# Patient Record
Sex: Male | Born: 1990 | Race: Black or African American | Hispanic: No | Marital: Single | Smoking: Former smoker
Health system: Southern US, Community
[De-identification: ages and names within clinical notes are randomized; demographics above are authoritative.]

---
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
## 2008-02-16 IMAGING — CR DG CHEST 2V
2 series · 2 of 2 positions shown · non-contrast
Comparison: None.

CLINICAL DATA: Difficulty breathing.   Chest tightness.  
 CHEST - 2 VIEW:

[view not recorded (1 of 2)]
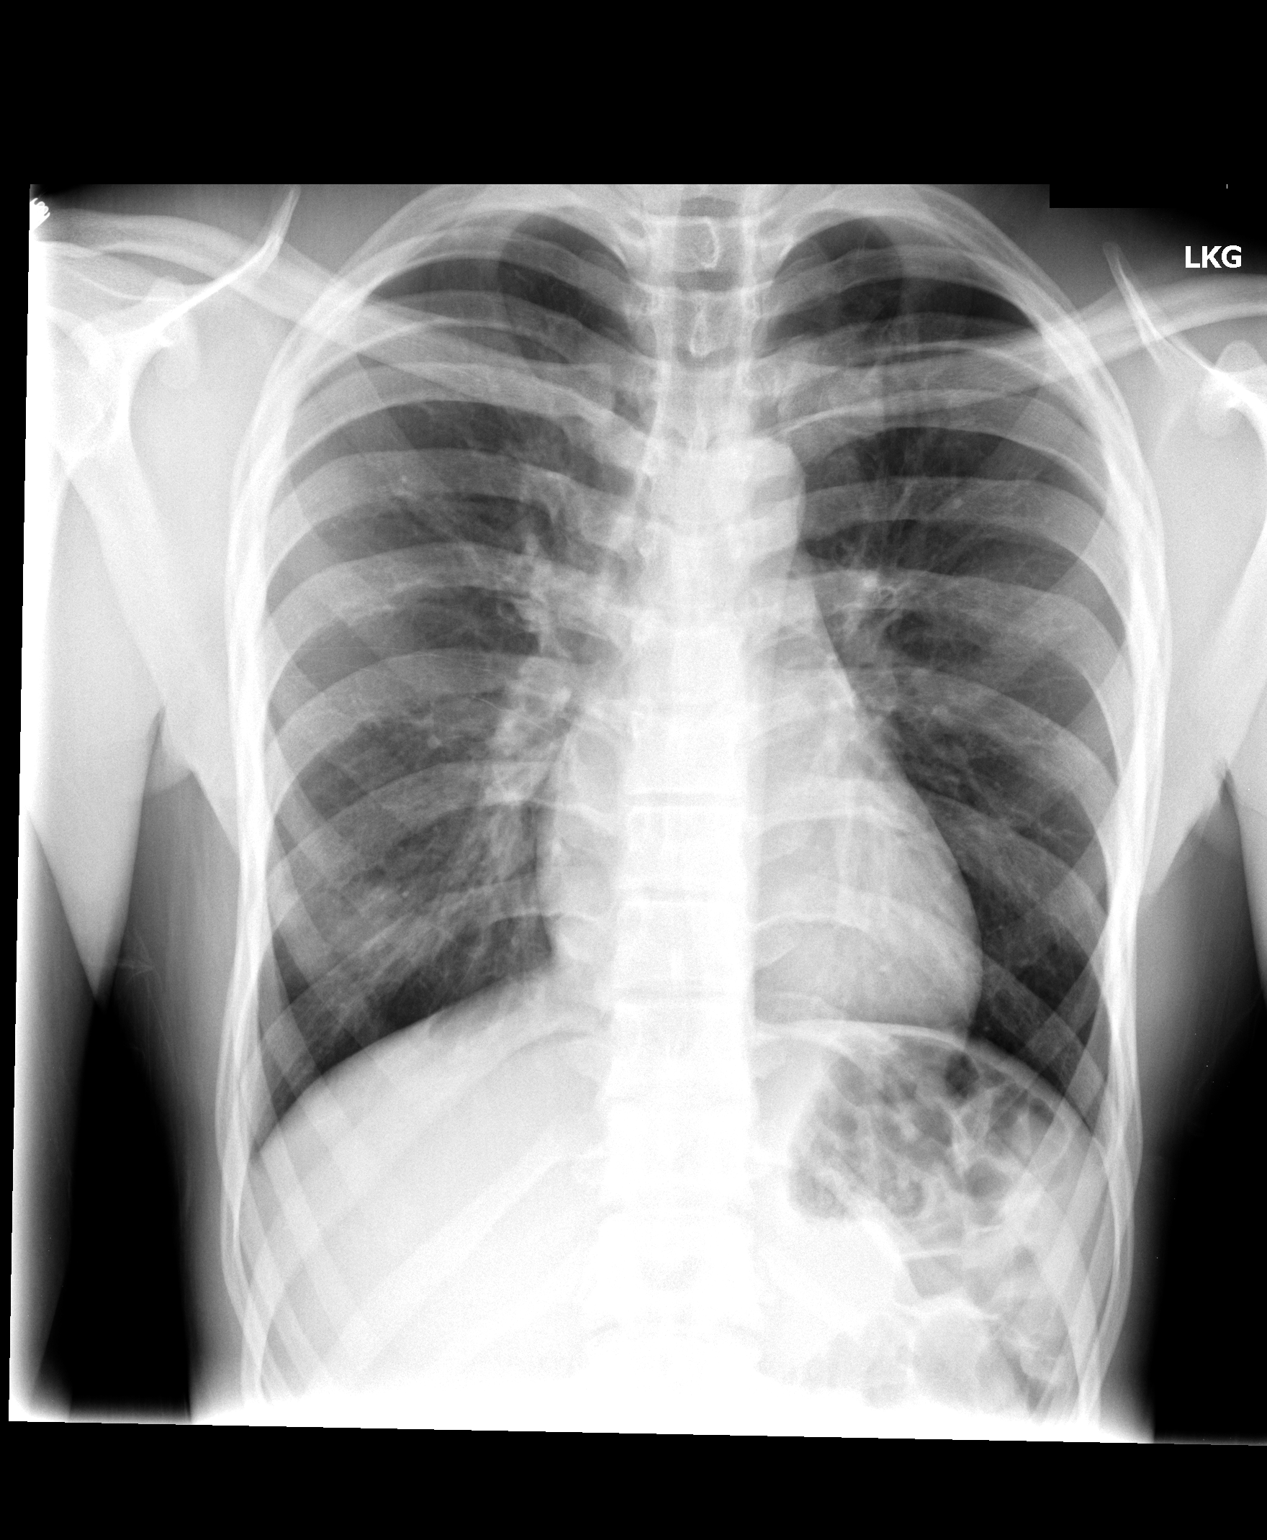

[view not recorded (2 of 2)]
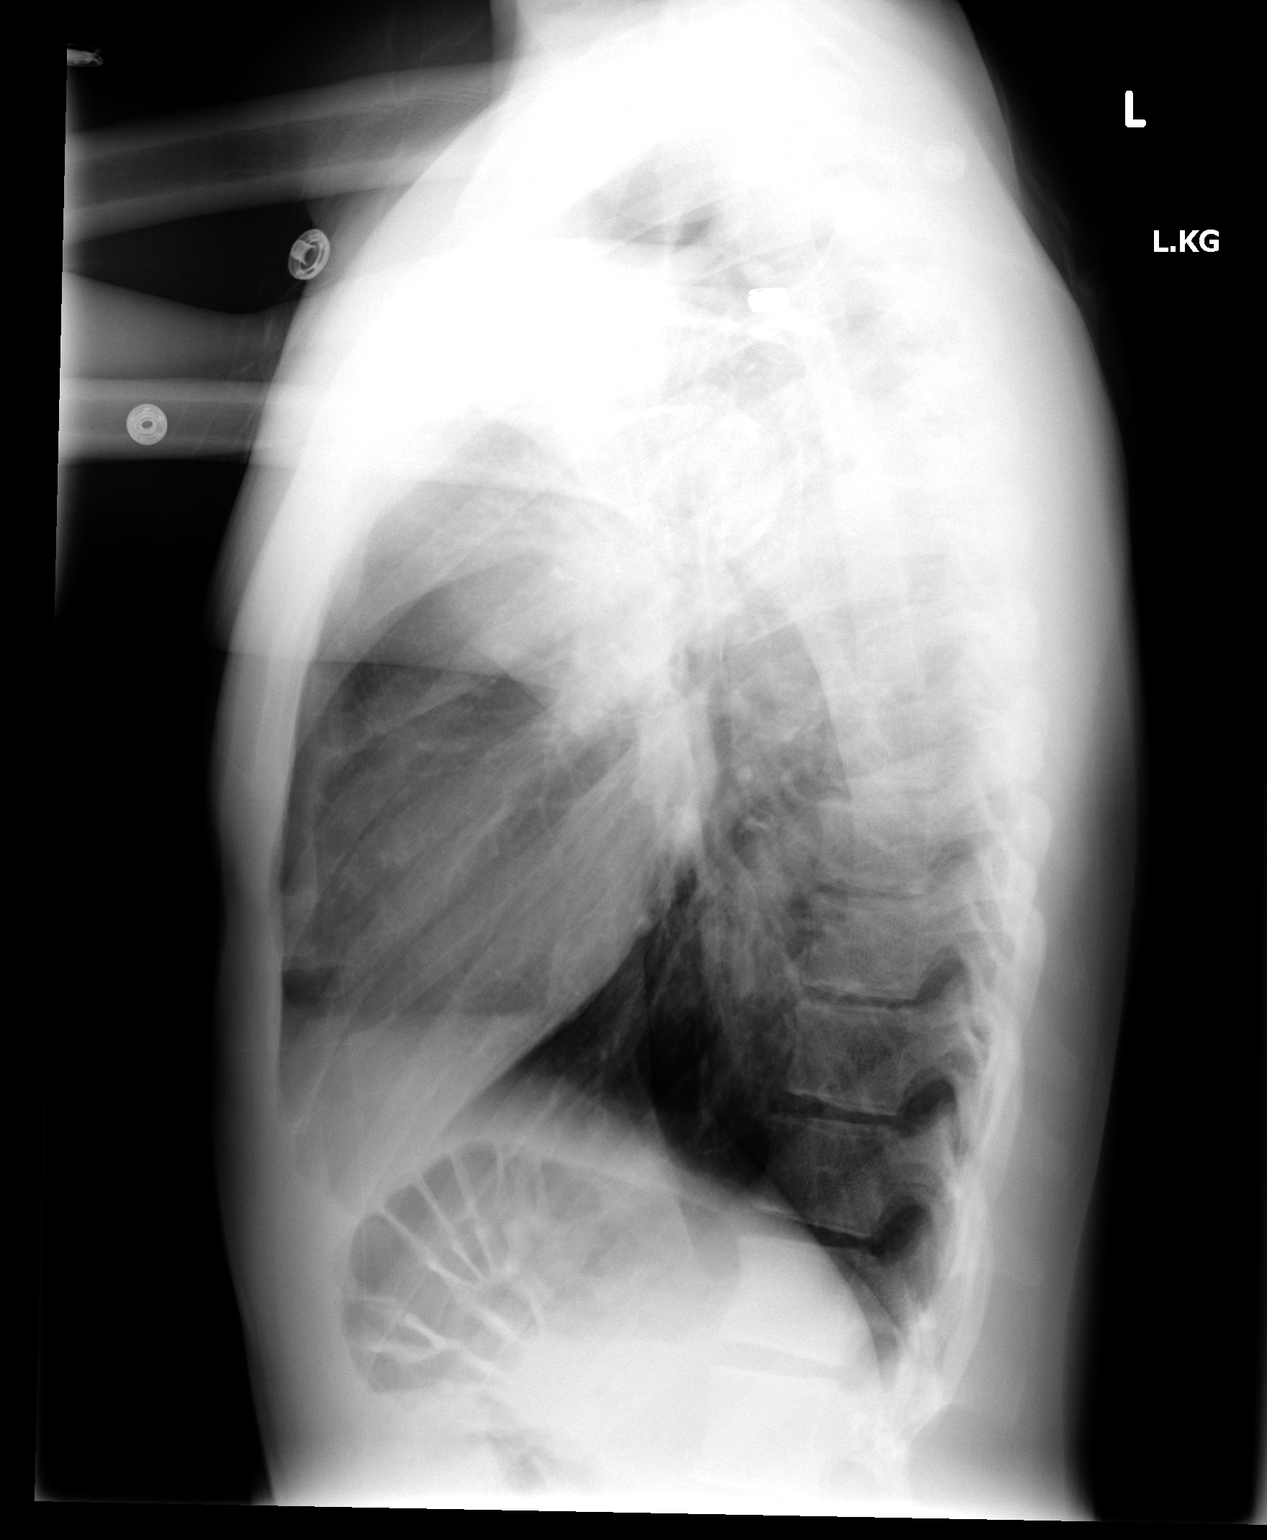

[2 of 2 positions shown; findings below may reference images not displayed]

FINDINGS: The trachea is midline.  Heart size is normal.  There is lobulated soft tissue density projecting over the lower right hemithorax.  The lungs are otherwise clear.   No pleural fluid.
IMPRESSION: Lobulated soft tissue density projecting over the right lung base.    Question gynecomastia.  Infectious etiology not excluded.   Please correlate clinically.  
 Chest CT or follow-up PA and lateral views of the chest would be useful in further evaluation, as clinically indicated.

## 2008-10-20 ENCOUNTER — Emergency Department (HOSPITAL_COMMUNITY): Admission: EM | Admit: 2008-10-20 | Discharge: 2008-10-20 | Payer: Self-pay | Admitting: Family Medicine

## 2010-11-17 NOTE — Discharge Summary (Signed)
[place]  Patient:    Steven [name]                   MRN: [id number] Adm. Date:  [id number] Disc. Date: 12/30/00 Attending:  Asher Muir Dictator:   Emelda Fear, M.D.                           Discharge Summary  DATE OF BIRTH:  1991/02/24  DISCHARGE MEDICATIONS: 1. Prednisone 40 mg 2 tablets q.a.m. x 5 days. 2. Albuterol MDI with spacer 2 puffs q.i.d. x 5 days, then take only as    needed. 3. Serevent MDI with spacer 2 puffs b.i.d.  Start medication once prednisone    regimen completed. 4. Flovent MDI with spacer 2 puffs b.i.d.  HISTORY OF PRESENT ILLNESS AND HOSPITAL COURSE:  This is a 20 year old African-American male who presented Friday, December 27, 2000, secondary to shortness of breath and wheezing.  Per patient and mother, the patient had been on a regimen of Flovent 220 b.i.d., Serevent b.i.d., Zyrtec 10 q.d., Singulair 10 q.d., and Nasacort for several years.  Approximately seven months ago the patient decided to take himself off of those medications.  Two days before presentation the patient began taking albuterol nebulizers b.i.d. on his own when the shortness of breath began.  Upon presentation to the emergency department, the patient was evaluated and decided to be admitted secondary to status asthmaticus.  At that time the patient was begun on Solu-Medrol ______ mg x 1, albuterol.  Through the course the patient was also started on Atrovent, Orapred 40 mg b.i.d.  During the patients hospital course he improved considerably, with peak flows, O2 saturations being monitored throughout the day.  On the day of discharge the patient was playing in the playroom, eating full diet, without any difficulties with breathing.  DISCHARGE DIAGNOSIS:  Status post status asthmaticus secondary to noncompliance with medications.  ACTIVITY:  No restrictions.  DIET:  No restrictions.  DISCHARGE INSTRUCTIONS:  The patient received  special instructions to please return to the emergency department for severe shortness of breath or wheezing.  FOLLOW-UP:  Follow-up visit with his primary care physician, Dr. Manus Gunning, Friday, January 03, 2001, at 11:15.  ADDITIONAL NOTE:  The patient received detailed asthma education during his hospital course, with videos and social work working closely with him and his mother.  Compliance with emphasized to the mother and the patient.  The mother refused home asthma assessment and evaluation. DD:  12/30/00 TD:  12/30/00 Job: [id number]

## 2022-08-09 ENCOUNTER — Other Ambulatory Visit: Payer: Self-pay

## 2022-08-09 ENCOUNTER — Emergency Department (HOSPITAL_COMMUNITY)
Admission: EM | Admit: 2022-08-09 | Discharge: 2022-08-09 | Disposition: A | Discharge status: Home / Self Care | Payer: Self-pay | Attending: Emergency Medicine | Admitting: Emergency Medicine

## 2022-08-09 ENCOUNTER — Encounter (HOSPITAL_COMMUNITY): Payer: Self-pay | Admitting: Emergency Medicine

## 2022-08-09 DIAGNOSIS — M62838 Other muscle spasm: Secondary | ICD-10-CM | POA: Diagnosis not present

## 2022-08-09 DIAGNOSIS — Y92410 Unspecified street and highway as the place of occurrence of the external cause: Secondary | ICD-10-CM | POA: Diagnosis not present

## 2022-08-09 DIAGNOSIS — M542 Cervicalgia: Secondary | ICD-10-CM | POA: Diagnosis present

## 2022-08-09 MED ORDER — LIDOCAINE 5 % EX PTCH
2.0000 | MEDICATED_PATCH | CUTANEOUS | Status: DC
  Administered 2022-08-09: 2 via TRANSDERMAL
  Filled 2022-08-09: qty 2
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
## 2023-10-03 ENCOUNTER — Encounter (HOSPITAL_COMMUNITY): Payer: Self-pay

## 2023-10-03 ENCOUNTER — Ambulatory Visit (HOSPITAL_COMMUNITY)
Admission: RE | Admit: 2023-10-03 | Discharge: 2023-10-03 | Disposition: A | Discharge status: Home / Self Care | Payer: Self-pay | Source: Ambulatory Visit | Attending: Internal Medicine | Admitting: Internal Medicine

## 2023-10-03 VITALS — BP 160/110 | HR 65 | Temp 98.5°F | Resp 14

## 2023-10-03 DIAGNOSIS — R030 Elevated blood-pressure reading, without diagnosis of hypertension: Secondary | ICD-10-CM

## 2023-10-03 DIAGNOSIS — H5789 Other specified disorders of eye and adnexa: Secondary | ICD-10-CM

## 2023-10-03 MED ORDER — TOBRAMYCIN-DEXAMETHASONE 0.3-0.1 % OP SUSP: 1.0000 [drp] | Freq: Two times a day (BID) | OPHTHALMIC | 0 refills | Status: AC

## 2023-10-03 MED ORDER — FLUORESCEIN SODIUM 1 MG OP STRP
ORAL_STRIP | OPHTHALMIC | Status: AC
  Filled 2023-10-03: qty 1

## 2023-10-03 NOTE — Discharge Instructions (Signed)
 Follow up with an eye doctor if you do not improve or get worse this week.  Monitor your blood pressure and if your reading are more than 140/90 three times, you will need medication and need to follow up with a primary care doctor
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
## 2023-11-13 ENCOUNTER — Ambulatory Visit: Payer: Self-pay | Admitting: Nurse Practitioner

## 2023-12-31 ENCOUNTER — Ambulatory Visit: Payer: Self-pay | Admitting: Nurse Practitioner
